# Patient Record
Sex: Male | Born: 1995 | Hispanic: No | Marital: Single | Smoking: Never smoker
Health system: Southern US, Community
[De-identification: ages and names within clinical notes are randomized; demographics above are authoritative.]

## PROBLEM LIST (undated history)

## (undated) ENCOUNTER — Emergency Department: Discharge status: Absent without leave | Payer: BLUE CROSS/BLUE SHIELD

---
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
## 2013-02-26 ENCOUNTER — Emergency Department (HOSPITAL_BASED_OUTPATIENT_CLINIC_OR_DEPARTMENT_OTHER)
Admission: EM | Admit: 2013-02-26 | Discharge: 2013-02-26 | Disposition: A | Discharge status: Home / Self Care | Payer: Medicaid Other | Attending: Emergency Medicine | Admitting: Emergency Medicine

## 2013-02-26 ENCOUNTER — Encounter (HOSPITAL_BASED_OUTPATIENT_CLINIC_OR_DEPARTMENT_OTHER): Payer: Self-pay | Admitting: Emergency Medicine

## 2013-02-26 DIAGNOSIS — R05 Cough: Secondary | ICD-10-CM | POA: Insufficient documentation

## 2013-02-26 DIAGNOSIS — G510 Bell's palsy: Secondary | ICD-10-CM | POA: Insufficient documentation

## 2013-02-26 DIAGNOSIS — J3489 Other specified disorders of nose and nasal sinuses: Secondary | ICD-10-CM | POA: Insufficient documentation

## 2013-02-26 DIAGNOSIS — R059 Cough, unspecified: Secondary | ICD-10-CM | POA: Insufficient documentation

## 2013-02-26 DIAGNOSIS — H5789 Other specified disorders of eye and adnexa: Secondary | ICD-10-CM | POA: Insufficient documentation

## 2013-02-26 DIAGNOSIS — IMO0002 Reserved for concepts with insufficient information to code with codable children: Secondary | ICD-10-CM | POA: Insufficient documentation

## 2013-02-26 DIAGNOSIS — Q674 Other congenital deformities of skull, face and jaw: Secondary | ICD-10-CM | POA: Insufficient documentation

## 2013-02-26 MED ORDER — PREDNISONE 20 MG PO TABS: 60.0000 mg | ORAL_TABLET | Freq: Every day | ORAL | Status: AC

## 2013-02-26 NOTE — ED Provider Notes (Signed)
CSN: [id number]     Arrival date & time 02/26/13  1217 History   First MD Initiated Contact with Patient 02/26/13 1342     Chief Complaint  Patient presents with  . Numbness   (Consider location/radiation/quality/duration/timing/severity/associated sxs/prior Treatment) HPI  Patient is a 17 yo male with no past medical history who presents with onset of left facial weakness. Notes this started 3 days ago. Noted left eye could not close and left mouth was drooping. Notes no issues swallowing. Notes that his left eye has become red and is watering. He denies any numbness or weakness. He denies history of bells palsy. Notes history of cough, congestion, and watery eyes 1-2 weeks ago that has resolved.  History reviewed. No pertinent past medical history. History reviewed. No pertinent past surgical history. No family history on file. History  Substance Use Topics  . Smoking status: Never Smoker   . Smokeless tobacco: Not on file  . Alcohol Use: No    Review of Systems  Constitutional: Negative for fever.  HENT: Positive for congestion.   Eyes: Positive for redness.  Respiratory: Positive for cough. Negative for chest tightness and shortness of breath.   Cardiovascular: Negative for chest pain.  Gastrointestinal: Negative for abdominal pain.  Musculoskeletal: Negative for gait problem.  Neurological: Positive for facial asymmetry. Negative for weakness, numbness and headaches.    Allergies  Review of patient's allergies indicates no known allergies.  Home Medications   Current Outpatient Rx  Name  Route  Sig  Dispense  Refill  . ondansetron (ZOFRAN ODT) 8 MG disintegrating tablet      8mg  ODT q8  hours prn nausea   4 tablet   0   . predniSONE (DELTASONE) 20 MG tablet   Oral   Take 3 tablets (60 mg total) by mouth daily.   21 tablet   0    BP 126/73  Pulse 62  Temp(Src) 98.6 F (37 C) (Oral)  Resp 16  SpO2 100% Physical Exam  Constitutional: He appears  well-developed and well-nourished. No distress.  HENT:  Head: Normocephalic and atraumatic.  Mouth/Throat: Oropharynx is clear and moist.  Eyes: EOM are normal. Pupils are equal, round, and reactive to light. Right eye exhibits no discharge. Left eye exhibits no discharge.  Cardiovascular: Normal rate, regular rhythm and normal heart sounds.   Pulmonary/Chest: Effort normal and breath sounds normal.  Musculoskeletal: He exhibits no edema.  Neurological: He is alert.  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    ED Course  Procedures (including critical care time) Labs Review Labs Reviewed - No data to display Imaging Review No results found.  EKG Interpretation   None       MDM   1. Seventh nerve palsy    Patient seen and examined. Patient with apparent isolated seventh nerve palsy following reported URI. Discussed with the patient that this is something that can follow a URI. Unlikely to be a CVA given normal neuro exam and given that forehead as well as lower face in involved. Discussed that the most important issue is protecting his eye from becoming too dry. Advised to obtain artifical tears to use every  2-3 hours as needed or more frequently if his eye feels dry and to obtain lacri-lube to use at night for his eye. Will treat with a course of prednisone given still in the acute setting. Patient and mother given contact information for Dr Anne Hahn of [facility] for follow-up of this issue. Advised to refrain from playing soccer until able to close his left eye to limit potential exposures.  Discussed return precautions with the patient and his mother and they voiced understanding.  This patient was discussed and seen with my attending Dr Rosalia Hammers.  Marikay Alar, MD [facility] PGY-2 02/26/13 3:45 pm    Glori Luis, MD 02/26/13 [phone]

## 2013-02-26 NOTE — ED Notes (Signed)
3 days he has had numbness to the left side of his face. Unable to completely close his left eye. Paralysis on the left side of his mouth. No hx of Bells Palsy.

## 2013-02-28 NOTE — ED Provider Notes (Signed)
History/physical exam/procedure(Rodriguez) were performed by non-physician practitioner and as supervising physician I was immediately available for consultation/collaboration. I have reviewed all notes and am in agreement with care and plan.  I performed a history and physical examination of Gregg Rodriguez and discussed his management with Dr. Birdie Sons.  I agree with the history, physical, assessment, and plan of care, with the following exceptions: None  I was present for the following procedures: None Time Spent in Critical Care of the patient: None Time spent in discussions with the patient and family: 5  Gregg Rodriguez    [name], MD 02/28/13 1459

## 2013-10-08 ENCOUNTER — Emergency Department (HOSPITAL_BASED_OUTPATIENT_CLINIC_OR_DEPARTMENT_OTHER)
Admission: EM | Admit: 2013-10-08 | Discharge: 2013-10-08 | Disposition: A | Discharge status: Home / Self Care | Payer: Medicaid Other | Attending: Emergency Medicine | Admitting: Emergency Medicine

## 2013-10-08 ENCOUNTER — Encounter (HOSPITAL_BASED_OUTPATIENT_CLINIC_OR_DEPARTMENT_OTHER): Payer: Self-pay | Admitting: Emergency Medicine

## 2013-10-08 ENCOUNTER — Emergency Department (HOSPITAL_BASED_OUTPATIENT_CLINIC_OR_DEPARTMENT_OTHER): Payer: Medicaid Other

## 2013-10-08 DIAGNOSIS — IMO0002 Reserved for concepts with insufficient information to code with codable children: Secondary | ICD-10-CM | POA: Insufficient documentation

## 2013-10-08 DIAGNOSIS — R1032 Left lower quadrant pain: Secondary | ICD-10-CM | POA: Insufficient documentation

## 2013-10-08 DIAGNOSIS — IMO0001 Reserved for inherently not codable concepts without codable children: Secondary | ICD-10-CM | POA: Insufficient documentation

## 2013-10-08 DIAGNOSIS — R42 Dizziness and giddiness: Secondary | ICD-10-CM | POA: Insufficient documentation

## 2013-10-08 DIAGNOSIS — R109 Unspecified abdominal pain: Secondary | ICD-10-CM

## 2013-10-08 DIAGNOSIS — J029 Acute pharyngitis, unspecified: Secondary | ICD-10-CM | POA: Insufficient documentation

## 2013-10-08 DIAGNOSIS — R1012 Left upper quadrant pain: Secondary | ICD-10-CM | POA: Insufficient documentation

## 2013-10-08 DIAGNOSIS — R197 Diarrhea, unspecified: Secondary | ICD-10-CM | POA: Insufficient documentation

## 2013-10-08 DIAGNOSIS — R112 Nausea with vomiting, unspecified: Secondary | ICD-10-CM | POA: Insufficient documentation

## 2013-10-08 LAB — CBC WITH DIFFERENTIAL/PLATELET
Basophils Absolute: 0 K/uL (ref 0.0–0.1)
Basophils Relative: 0 % (ref 0–1)
Eosinophils Absolute: 0 K/uL (ref 0.0–0.7)
Eosinophils Relative: 0 % (ref 0–5)
HCT: 47.5 % (ref 39.0–52.0)
Hemoglobin: 17.1 g/dL — ABNORMAL HIGH (ref 13.0–17.0)
Lymphocytes Relative: 7 % — ABNORMAL LOW (ref 12–46)
Lymphs Abs: 0.8 K/uL (ref 0.7–4.0)
MCH: 29.3 pg (ref 26.0–34.0)
MCHC: 36 g/dL (ref 30.0–36.0)
MCV: 81.3 fL (ref 78.0–100.0)
Monocytes Absolute: 0.6 K/uL (ref 0.1–1.0)
Monocytes Relative: 5 % (ref 3–12)
Neutro Abs: 10.4 K/uL — ABNORMAL HIGH (ref 1.7–7.7)
Neutrophils Relative %: 88 % — ABNORMAL HIGH (ref 43–77)
Platelets: 234 K/uL (ref 150–400)
RBC: 5.84 MIL/uL — ABNORMAL HIGH (ref 4.22–5.81)
RDW: 13.9 % (ref 11.5–15.5)
WBC: 11.9 K/uL — ABNORMAL HIGH (ref 4.0–10.5)

## 2013-10-08 LAB — COMPREHENSIVE METABOLIC PANEL WITH GFR
ALT: 39 U/L (ref 0–53)
AST: 30 U/L (ref 0–37)
Albumin: 4.8 g/dL (ref 3.5–5.2)
Alkaline Phosphatase: 73 U/L (ref 39–117)
BUN: 12 mg/dL (ref 6–23)
CO2: 26 meq/L (ref 19–32)
Calcium: 10 mg/dL (ref 8.4–10.5)
Chloride: 99 meq/L (ref 96–112)
Creatinine, Ser: 1.1 mg/dL (ref 0.50–1.35)
GFR calc Af Amer: >90 mL/min (ref >90)
GFR calc non Af Amer: >90 mL/min (ref >90)
Glucose, Bld: 105 mg/dL — ABNORMAL HIGH (ref 70–99)
Potassium: 4.3 meq/L (ref 3.7–5.3)
Sodium: 138 meq/L (ref 137–147)
Total Bilirubin: 1 mg/dL (ref 0.3–1.2)
Total Protein: 8.3 g/dL (ref 6.0–8.3)

## 2013-10-08 LAB — LIPASE, BLOOD: Lipase: 17 U/L (ref 11–59)

## 2013-10-08 MED ORDER — SODIUM CHLORIDE 0.9 % IV SOLN
INTRAVENOUS | Status: AC
  Administered 2013-10-08: 18:00:00 via INTRAVENOUS

## 2013-10-08 MED ORDER — ONDANSETRON 8 MG PO TBDP: 8.0000 mg | ORAL_TABLET | Freq: Three times a day (TID) | ORAL | Status: AC | PRN

## 2013-10-08 MED ORDER — ONDANSETRON HCL 4 MG/2ML IJ SOLN
4.0000 mg | Freq: Once | INTRAMUSCULAR | Status: AC
  Administered 2013-10-08: 4 mg via INTRAVENOUS
  Filled 2013-10-08: qty 2

## 2013-10-08 NOTE — ED Provider Notes (Signed)
CSN: [id number]     Arrival date & time 10/08/13  1608 History   First MD Initiated Contact with Patient 10/08/13 1705     Chief Complaint  Patient presents with  . Fever     (Consider location/radiation/quality/duration/timing/severity/associated sxs/prior Treatment) Patient is a 18 y.o. male presenting with fever. The history is provided by the patient.  Fever Max temp prior to arrival:  99 Temp source:  Subjective and axillary Severity:  Mild Onset quality:  Gradual Duration:  30 days Progression:  Unchanged Chronicity:  New Relieved by:  Acetaminophen Worsened by:  Nothing tried Associated symptoms: chills, cough, diarrhea, headaches, myalgias, nausea, sore throat (with cough) and vomiting   Associated symptoms: no confusion, no congestion, no dysuria, no ear pain and no rash  Chest pain: with cough.    Gregg Rodriguez is a 18 y.o. male who presents to the ED with complaint of fever off and on for the past month. For the past 2 days he has had vomiting and loose stools. He complains of left side abdominal pain.   History reviewed. No pertinent past medical history. History reviewed. No pertinent past surgical history. No family history on file. History  Substance Use Topics  . Smoking status: Never Smoker   . Smokeless tobacco: Not on file  . Alcohol Use: No    Review of Systems  Constitutional: Positive for fever and chills.  HENT: Positive for sore throat (with cough). Negative for congestion, ear pain, mouth sores and trouble swallowing.   Eyes: Negative for redness, itching and visual disturbance.  Respiratory: Positive for cough.   Cardiovascular: Chest pain: with cough.  Gastrointestinal: Positive for nausea, vomiting, abdominal pain and diarrhea.  Genitourinary: Negative for dysuria, urgency and flank pain.  Musculoskeletal: Positive for myalgias. Negative for back pain.  Skin: Negative for rash.  Neurological: Positive for light-headedness and headaches.   Psychiatric/Behavioral: Negative for confusion. The patient is not nervous/anxious.       Allergies  Review of patient's allergies indicates no known allergies.  Home Medications   Prior to Admission medications   Medication Sig Start Date End Date Taking? Authorizing Provider  ondansetron (ZOFRAN ODT) 8 MG disintegrating tablet 8mg  ODT q8  hours prn nausea 06/28/12   April K Palumbo-Rasch, MD  predniSONE (DELTASONE) 20 MG tablet Take 3 tablets (60 mg total) by mouth daily. 02/26/13   Glori Luis, MD   BP 136/73  Pulse 94  Temp(Src) 98.3 F (36.8 C) (Oral)  Resp 22  Ht 5\' 9"  (1.753 m)  Wt 185 lb (83.915 kg)  BMI 27.31 kg/m2  SpO2 100% Physical Exam  Nursing note and vitals reviewed. Constitutional: He is oriented to person, place, and time. He appears well-developed and well-nourished. No distress.  HENT:  Head: Normocephalic and atraumatic.  Right Ear: Tympanic membrane normal.  Left Ear: Tympanic membrane normal.  Nose: Nose normal.  Mouth/Throat: Uvula is midline, oropharynx is clear and moist and mucous membranes are normal.  Eyes: EOM are normal.  Neck: Neck supple.  Cardiovascular: Normal rate, regular rhythm and normal heart sounds.   Pulmonary/Chest: Effort normal. He has no wheezes. He has no rales.  Abdominal: Soft. Bowel sounds are increased. There is tenderness in the left upper quadrant and left lower quadrant. There is no rebound, no guarding and no CVA tenderness.  Musculoskeletal: Normal range of motion.  Neurological: He is alert and oriented to person, place, and time. No cranial nerve deficit.  Skin: Skin is warm and dry.  Psychiatric: He has a normal mood and affect. His behavior is normal.    ED Course  Procedures (including critical care time) Labs Review Results for orders placed during the hospital encounter of 10/08/13 (from the past 24 hour(s))  CBC WITH DIFFERENTIAL     Status: Abnormal   Collection Time    10/08/13  6:08 PM       Result Value Ref Range   WBC 11.9 (*) 4.0 - 10.5 K/uL   RBC 5.84 (*) 4.22 - 5.81 MIL/uL   Hemoglobin 17.1 (*) 13.0 - 17.0 g/dL   HCT 19.147.5  47.839.0 - 29.552.0 %   MCV 81.3  78.0 - 100.0 fL   MCH 29.3  26.0 - 34.0 pg   MCHC 36.0  30.0 - 36.0 g/dL   RDW 62.113.9  30.811.5 - 65.715.5 %   Platelets 234  150 - 400 K/uL   Neutrophils Relative % 88 (*) 43 - 77 %   Neutro Abs 10.4 (*) 1.7 - 7.7 K/uL   Lymphocytes Relative 7 (*) 12 - 46 %   Lymphs Abs 0.8  0.7 - 4.0 K/uL   Monocytes Relative 5  3 - 12 %   Monocytes Absolute 0.6  0.1 - 1.0 K/uL   Eosinophils Relative 0  0 - 5 %   Eosinophils Absolute 0.0  0.0 - 0.7 K/uL   Basophils Relative 0  0 - 1 %   Basophils Absolute 0.0  0.0 - 0.1 K/uL  COMPREHENSIVE METABOLIC PANEL     Status: Abnormal   Collection Time    10/08/13  6:08 PM      Result Value Ref Range   Sodium 138  137 - 147 mEq/L   Potassium 4.3  3.7 - 5.3 mEq/L   Chloride 99  96 - 112 mEq/L   CO2 26  19 - 32 mEq/L   Glucose, Bld 105 (*) 70 - 99 mg/dL   BUN 12  6 - 23 mg/dL   Creatinine, Ser 8.461.10  0.50 - 1.35 mg/dL   Calcium 96.210.0  8.4 - 95.210.5 mg/dL   Total Protein 8.3  6.0 - 8.3 g/dL   Albumin 4.8  3.5 - 5.2 g/dL   AST 30  0 - 37 U/L   ALT 39  0 - 53 U/L   Alkaline Phosphatase 73  39 - 117 U/L   Total Bilirubin 1.0  0.3 - 1.2 mg/dL   GFR calc non Af Amer >90  >90 mL/min   GFR calc Af Amer >90  >90 mL/min  LIPASE, BLOOD     Status: None   Collection Time    10/08/13  6:08 PM      Result Value Ref Range   Lipase 17  11 - 59 U/L    Dg Chest 2 View  10/08/2013   CLINICAL DATA:  Intermittent fever for a month.  EXAM: CHEST  2 VIEW  COMPARISON:  None.  FINDINGS: Lungs are clear. Heart size is normal. No pneumothorax or pleural effusion.  IMPRESSION: Negative chest.   Electronically Signed   By: Drusilla Kannerhomas  Dalessio M.D.   On: 10/08/2013 18:24    MDM: I discussed this case with Dr. Elesa MassedWard   Patient states his abdominal pain has resolved after IV fluids and Zofran.  Patient re examined, abdomen soft,  non tender with palpation.   18 y.o. male with body aches, abdominal pain, n/v/d that has improved during his ER visit. Discussed with the patient and his family member reasons for  abdominal pain. Discussed CT scan. They state that since the pain has resolved and no n/v they will wait and see how he does. He will stay on clear liquids tonight and advance to the [employer]. He will return for worsening symptoms. Consider viral gastroenteritis, diverticulitis. I have reviewed this patient's vital signs, nurses notes, appropriate labs and imaging.  Patient stable for discharge without further screening at this time.    Medication List    TAKE these medications       ondansetron 8 MG disintegrating tablet  Commonly known as:  ZOFRAN ODT  Take 1 tablet (8 mg total) by mouth every 8 (eight) hours as needed for nausea or vomiting.      ASK your doctor about these medications       predniSONE 20 MG tablet  Commonly known as:  DELTASONE  Take 3 tablets (60 mg total) by mouth daily.           [place], [place] 10/09/13 [phone]

## 2013-10-08 NOTE — ED Notes (Signed)
Fever on and off for a month. Cough, headache, body aches, chills.

## 2013-10-08 NOTE — Discharge Instructions (Signed)
Take the medication as directed for nausea. Stay on a clear liquid diet tonight and then advance to the [employer]. Return for worsening symptoms.

## 2013-10-09 ENCOUNTER — Encounter (HOSPITAL_BASED_OUTPATIENT_CLINIC_OR_DEPARTMENT_OTHER): Payer: Self-pay | Admitting: Emergency Medicine

## 2013-10-09 ENCOUNTER — Emergency Department (HOSPITAL_BASED_OUTPATIENT_CLINIC_OR_DEPARTMENT_OTHER)
Admission: EM | Admit: 2013-10-09 | Discharge: 2013-10-09 | Disposition: A | Discharge status: Home / Self Care | Payer: Medicaid Other | Attending: Emergency Medicine | Admitting: Emergency Medicine

## 2013-10-09 ENCOUNTER — Emergency Department (HOSPITAL_BASED_OUTPATIENT_CLINIC_OR_DEPARTMENT_OTHER): Payer: Medicaid Other

## 2013-10-09 DIAGNOSIS — Z791 Long term (current) use of non-steroidal anti-inflammatories (NSAID): Secondary | ICD-10-CM | POA: Insufficient documentation

## 2013-10-09 DIAGNOSIS — IMO0002 Reserved for concepts with insufficient information to code with codable children: Secondary | ICD-10-CM | POA: Insufficient documentation

## 2013-10-09 DIAGNOSIS — R091 Pleurisy: Secondary | ICD-10-CM | POA: Insufficient documentation

## 2013-10-09 LAB — D-DIMER, QUANTITATIVE: D-Dimer, Quant: <0.27 ug/mL-FEU (ref 0.00–0.48)

## 2013-10-09 MED ORDER — NAPROXEN 375 MG PO TABS
375.0000 mg | ORAL_TABLET | Freq: Two times a day (BID) | ORAL | Status: AC
Start: 2013-10-09 — End: ?

## 2013-10-09 NOTE — ED Provider Notes (Signed)
CSN: [id number]     Arrival date & time 10/09/13  0612 History   First MD Initiated Contact with Patient 10/09/13 [phone]     Chief Complaint  Patient presents with  . Pleurisy     (Consider location/radiation/quality/duration/timing/severity/associated sxs/prior Treatment) HPI Comments: Pt was seen here yesterday for abd pain, n/v, cough.  He comes back today for chest pain.  States that he was sleeping and woke up with sharp intense pain to right chest, non-radiating.  Worse with breathing, associated with SOB.  Lasted a couple of hours.  Has subsided now.  Denies any symptoms now.  States that his abd pain, vomiting has resolved.  No current fevers.  No leg pain or swelling.  No smoking hx.  No recent immobilization.     History reviewed. No pertinent past medical history. History reviewed. No pertinent past surgical history. History reviewed. No pertinent family history. History  Substance Use Topics  . Smoking status: Never Smoker   . Smokeless tobacco: Not on file  . Alcohol Use: No    Review of Systems  Constitutional: Negative for fever, chills, diaphoresis and fatigue.  HENT: Negative for congestion, rhinorrhea and sneezing.   Eyes: Negative.   Respiratory: Positive for shortness of breath. Negative for cough and chest tightness.   Cardiovascular: Positive for chest pain. Negative for leg swelling.  Gastrointestinal: Negative for nausea, vomiting, abdominal pain, diarrhea and blood in stool.  Genitourinary: Negative for frequency, hematuria, flank pain and difficulty urinating.  Musculoskeletal: Negative for arthralgias and back pain.  Skin: Negative for rash.  Neurological: Negative for dizziness, speech difficulty, weakness, numbness and headaches.      Allergies  Review of patient's allergies indicates no known allergies.  Home Medications   Prior to Admission medications   Medication Sig Start Date End Date Taking? Authorizing Provider  naproxen (NAPROSYN) 375 MG  tablet Take 1 tablet (375 mg total) by mouth 2 (two) times daily. 10/09/13   Rolan BuccoMelanie Latresha Yahr, MD  ondansetron (ZOFRAN ODT) 8 MG disintegrating tablet Take 1 tablet (8 mg total) by mouth every 8 (eight) hours as needed for nausea or vomiting. 10/08/13   Hope Orlene OchM Neese, NP  predniSONE (DELTASONE) 20 MG tablet Take 3 tablets (60 mg total) by mouth daily. 02/26/13   Glori LuisEric G Sonnenberg, MD   BP 134/81  Pulse 60  Temp(Src) 98.2 F (36.8 C) (Oral)  Resp 19  Ht 5\' 9"  (1.753 m)  Wt 185 lb (83.915 kg)  BMI 27.31 kg/m2  SpO2 99% Physical Exam  Constitutional: He is oriented to person, place, and time. He appears well-developed and well-nourished.  HENT:  Head: Normocephalic and atraumatic.  Eyes: Pupils are equal, round, and reactive to light.  Neck: Normal range of motion. Neck supple.  Cardiovascular: Normal rate, regular rhythm and normal heart sounds.   Pulmonary/Chest: Effort normal and breath sounds normal. No respiratory distress. He has no wheezes. He has no rales. He exhibits no tenderness.  Abdominal: Soft. Bowel sounds are normal. There is no tenderness. There is no rebound and no guarding.  Musculoskeletal: Normal range of motion. He exhibits no edema.  No calf tenderness  Lymphadenopathy:    He has no cervical adenopathy.  Neurological: He is alert and oriented to person, place, and time.  Skin: Skin is warm and dry. No rash noted.  Psychiatric: He has a normal mood and affect.    ED Course  Procedures (including critical care time) Labs Review Labs Reviewed  D-DIMER, QUANTITATIVE  Imaging Review Dg Chest 2 View  10/09/2013   CLINICAL DATA:  Right chest pain, smoker.  EXAM: CHEST  2 VIEW  COMPARISON:  Chest radiograph October 08, 2013.  FINDINGS: Cardiomediastinal silhouette is unremarkable. The lungs are clear without pleural effusions or focal consolidations. Trachea projects midline and there is no pneumothorax. Soft tissue planes and included osseous structures are non-suspicious.   IMPRESSION: No acute cardiopulmonary process; normal chest radiograph.   Electronically Signed   By: Awilda Metro   On: 10/09/2013 06:55   Dg Chest 2 View  10/08/2013   CLINICAL DATA:  Intermittent fever for a month.  EXAM: CHEST  2 VIEW  COMPARISON:  None.  FINDINGS: Lungs are clear. Heart size is normal. No pneumothorax or pleural effusion.  IMPRESSION: Negative chest.   Electronically Signed   By: Drusilla Kanner M.D.   On: 10/08/2013 18:24     EKG Interpretation None      MDM   Final diagnoses:  Pleurisy    Pt with episode of sharp, pleuritic right sided CP.  Resolved now.  Symptoms not consistent with ACS.  No signs of PTX, pneumonia.  No suggestions of PE, negative D-dimer.  Likely pleurisy.  Will d/c with rx for naprosyn.  F/u as needed.    Rolan Bucco, MD 10/09/13 [phone]

## 2013-10-09 NOTE — Discharge Instructions (Signed)
Pleurisy  Pleurisy is an inflammation and swelling of the lining of the lungs (pleura). Because of this inflammation, it hurts to breathe. It can be aggravated by coughing, laughing, or deep breathing. Pleurisy is often caused by an underlying infection or disease.   HOME CARE INSTRUCTIONS   Monitor your pleurisy for any changes. The following actions may help to alleviate any discomfort you are experiencing:   Medicine may help with pain. Only take over-the-counter or prescription medicines for pain, discomfort, or fever as directed by your health care provider.   Only take antibiotic medicine as directed. Make sure to finish it even if you start to feel better.  SEEK MEDICAL CARE IF:    Your pain is not controlled with medicine or is increasing.   You have an increase in pus-like (purulent) secretions brought up with coughing.  SEEK IMMEDIATE MEDICAL CARE IF:    You have blue or dark lips, fingernails, or toenails.   You are coughing up blood.   You have increased difficulty breathing.   You have continuing pain unrelieved by medicine or pain lasting more than 1 week.   You have pain that radiates into your neck, arms, or jaw.   You develop increased shortness of breath or wheezing.   You develop a fever, rash, vomiting, fainting, or other serious symptoms.  MAKE SURE YOU:   Understand these instructions.    Will watch your condition.    Will get help right away if you are not doing well or get worse.    Document Released: 04/19/2005 Document Revised: 12/20/2012 Document Reviewed: 10/01/2012  [employer] Patient Information 2014 [employer]

## 2013-10-09 NOTE — ED Notes (Addendum)
Pt reports he is having right chest pain awoke him from sleep admit to recent start smoking Hookah  But denies use in last several days

## 2013-10-11 NOTE — ED Provider Notes (Signed)
Medical screening examination/treatment/procedure(s) were performed by non-physician practitioner and as supervising physician I was immediately available for consultation/collaboration.   EKG Interpretation None        Kristen N Ward, DO 10/11/13 0802 

## 2014-09-05 ENCOUNTER — Emergency Department (HOSPITAL_BASED_OUTPATIENT_CLINIC_OR_DEPARTMENT_OTHER)
Admission: EM | Admit: 2014-09-05 | Discharge: 2014-09-05 | Disposition: A | Discharge status: Home / Self Care | Payer: BLUE CROSS/BLUE SHIELD | Attending: Emergency Medicine | Admitting: Emergency Medicine

## 2014-09-05 ENCOUNTER — Encounter (HOSPITAL_BASED_OUTPATIENT_CLINIC_OR_DEPARTMENT_OTHER): Payer: Self-pay | Admitting: Emergency Medicine

## 2014-09-05 DIAGNOSIS — M545 Low back pain, unspecified: Secondary | ICD-10-CM

## 2014-09-05 DIAGNOSIS — R42 Dizziness and giddiness: Secondary | ICD-10-CM | POA: Diagnosis not present

## 2014-09-05 DIAGNOSIS — M791 Myalgia: Secondary | ICD-10-CM | POA: Diagnosis not present

## 2014-09-05 DIAGNOSIS — R110 Nausea: Secondary | ICD-10-CM | POA: Diagnosis not present

## 2014-09-05 DIAGNOSIS — Z7952 Long term (current) use of systemic steroids: Secondary | ICD-10-CM | POA: Insufficient documentation

## 2014-09-05 DIAGNOSIS — H538 Other visual disturbances: Secondary | ICD-10-CM | POA: Diagnosis not present

## 2014-09-05 DIAGNOSIS — M79671 Pain in right foot: Secondary | ICD-10-CM | POA: Diagnosis not present

## 2014-09-05 DIAGNOSIS — Z791 Long term (current) use of non-steroidal anti-inflammatories (NSAID): Secondary | ICD-10-CM | POA: Insufficient documentation

## 2014-09-05 LAB — CBC WITH DIFFERENTIAL/PLATELET
BASOS PCT: 0 % (ref 0–1)
Basophils Absolute: 0 10*3/uL (ref 0.0–0.1)
EOS PCT: 2 % (ref 0–5)
Eosinophils Absolute: 0.2 10*3/uL (ref 0.0–0.7)
HEMATOCRIT: 45.2 % (ref 39.0–52.0)
Hemoglobin: 16 g/dL (ref 13.0–17.0)
Lymphocytes Relative: 36 % (ref 12–46)
Lymphs Abs: 2.8 10*3/uL (ref 0.7–4.0)
MCH: 28.8 pg (ref 26.0–34.0)
MCHC: 35.4 g/dL (ref 30.0–36.0)
MCV: 81.3 fL (ref 78.0–100.0)
MONO ABS: 0.6 10*3/uL (ref 0.1–1.0)
MONOS PCT: 8 % (ref 3–12)
Neutro Abs: 4.3 10*3/uL (ref 1.7–7.7)
Neutrophils Relative %: 54 % (ref 43–77)
Platelets: 238 10*3/uL (ref 150–400)
RBC: 5.56 MIL/uL (ref 4.22–5.81)
RDW: 13.4 % (ref 11.5–15.5)
WBC: 8 10*3/uL (ref 4.0–10.5)

## 2014-09-05 LAB — BASIC METABOLIC PANEL
Anion gap: 11 (ref 5–15)
BUN: 18 mg/dL (ref 6–20)
CO2: 28 mmol/L (ref 22–32)
Calcium: 9.9 mg/dL (ref 8.9–10.3)
Chloride: 103 mmol/L (ref 101–111)
Creatinine, Ser: 1.23 mg/dL (ref 0.61–1.24)
GFR calc Af Amer: >60 mL/min (ref >60)
GFR calc non Af Amer: >60 mL/min (ref >60)
Glucose, Bld: 95 mg/dL (ref 70–99)
Potassium: 3.9 mmol/L (ref 3.5–5.1)
Sodium: 142 mmol/L (ref 135–145)

## 2014-09-05 LAB — URINALYSIS, ROUTINE W REFLEX MICROSCOPIC
BILIRUBIN URINE: NEGATIVE
GLUCOSE, UA: NEGATIVE mg/dL
HGB URINE DIPSTICK: NEGATIVE
Ketones, ur: 15 mg/dL — AB
Leukocytes, UA: NEGATIVE
Nitrite: NEGATIVE
PH: 6 (ref 5.0–8.0)
Protein, ur: NEGATIVE mg/dL
SPECIFIC GRAVITY, URINE: 1.03 (ref 1.005–1.030)
UROBILINOGEN UA: 0.2 mg/dL (ref 0.0–1.0)

## 2014-09-05 MED ORDER — TRAMADOL HCL 50 MG PO TABS: 50.0000 mg | ORAL_TABLET | Freq: Four times a day (QID) | ORAL | Status: AC | PRN

## 2014-09-05 MED ORDER — NAPROXEN 500 MG PO TABS: 500.0000 mg | ORAL_TABLET | Freq: Two times a day (BID) | ORAL | Status: AC

## 2014-09-05 NOTE — ED Notes (Signed)
C/o lower back pain x 3 days and left foot tingling onset this pm,  Denies inj

## 2014-09-05 NOTE — ED Notes (Signed)
Pt states back pain and R foot pain x 3 days, got worse at work today. Denies injuries.

## 2014-09-05 NOTE — ED Provider Notes (Signed)
CSN: [id number]     Arrival date & time 09/05/14  1950 History  This chart was scribed for Vanetta MuldersScott Idona Stach, MD by Annye AsaAnna Dorsett, ED Scribe. This patient was seen in room MH02/MH02 and the patient's care was started at 9:49 PM.    Chief Complaint  Patient presents with  . Back Pain  . Foot Pain   Patient is a 19 y.o. male presenting with back pain. The history is provided by the patient. No language interpreter was used.  Back Pain Location:  Lumbar spine Quality:  Aching and stabbing Radiates to:  Does not radiate Pain severity:  Moderate Pain is:  Same all the time Onset quality:  Gradual Duration:  3 days Timing:  Constant Progression:  Worsening Chronicity:  New Context: not physical stress, not recent injury and not twisting   Relieved by:  Nothing Worsened by:  Nothing tried Ineffective treatments:  None tried Associated symptoms: no dysuria, no fever, no numbness and no weakness     HPI Comments: Gregg Rodriguez is a 19 y.o. male who presents to the Emergency Department complaining of 3 days of "sharp, aching" lower back pain (L>R, rated 7/10 at worst) and lightheadedness, with nausea beginning yesterday. No modifying factors noted at this time. Patient also reports an instance of "sharp" pain in the ball of his right foot, lasting about an hour before resolving without treatment. He notes recent blurred vision. He denies fevers, sore throat, cold symptoms, dysuria. He denies prior experience with back pain, denies recent known tick bites, denies recent trauma or injury.   History reviewed. No pertinent past medical history. History reviewed. No pertinent past surgical history. History reviewed. No pertinent family history. History  Substance Use Topics  . Smoking status: Never Smoker   . Smokeless tobacco: Not on file  . Alcohol Use: No    Review of Systems  Constitutional: Negative for fever and chills.  HENT: Negative for rhinorrhea and sore throat.   Eyes: Positive for  visual disturbance.  Respiratory: Negative for cough.   Cardiovascular: Negative for leg swelling.  Gastrointestinal: Positive for nausea. Negative for vomiting and diarrhea.  Genitourinary: Negative for dysuria, frequency and hematuria.  Musculoskeletal: Positive for back pain. Negative for neck pain.  Skin: Negative for rash.  Neurological: Positive for light-headedness. Negative for dizziness, weakness and numbness.  Hematological: Does not bruise/bleed easily.  Psychiatric/Behavioral: Negative for confusion.    Allergies  Review of patient's allergies indicates no known allergies.  Home Medications   Prior to Admission medications   Medication Sig Start Date End Date Taking? Authorizing Provider  naproxen (NAPROSYN) 375 MG tablet Take 1 tablet (375 mg total) by mouth 2 (two) times daily. 10/09/13   Rolan BuccoMelanie Belfi, MD  naproxen (NAPROSYN) 500 MG tablet Take 1 tablet (500 mg total) by mouth 2 (two) times daily. 09/05/14   Vanetta MuldersScott Roquel Burgin, MD  ondansetron (ZOFRAN ODT) 8 MG disintegrating tablet Take 1 tablet (8 mg total) by mouth every 8 (eight) hours as needed for nausea or vomiting. 10/08/13   Hope Orlene OchM Neese, NP  predniSONE (DELTASONE) 20 MG tablet Take 3 tablets (60 mg total) by mouth daily. 02/26/13   Glori LuisEric G Sonnenberg, MD  traMADol (ULTRAM) 50 MG tablet Take 1 tablet (50 mg total) by mouth every 6 (six) hours as needed. 09/05/14   Vanetta MuldersScott Ronelle Smallman, MD   BP 141/84 mmHg  Pulse 66  Temp(Src) 98.2 F (36.8 C) (Oral)  Resp 18  Ht 5\' 9"  (1.753 m)  Wt 180 lb (  81.647 kg)  BMI 26.57 kg/m2  SpO2 95% Physical Exam  Constitutional: He is oriented to person, place, and time. He appears well-developed and well-nourished.  HENT:  Head: Normocephalic and atraumatic.  Moist mucous membranes  Eyes: EOM are normal. Pupils are equal, round, and reactive to light. No scleral icterus.  Neck: No tracheal deviation present.  Cardiovascular: Normal rate, regular rhythm and normal heart sounds.  Exam  reveals no gallop and no friction rub.   No murmur heard. Pulmonary/Chest: Effort normal and breath sounds normal. No respiratory distress. He has no wheezes. He has no rales.  Abdominal: Soft. Bowel sounds are normal. He exhibits no distension. There is no tenderness. There is no rebound and no guarding.  Musculoskeletal: He exhibits no edema.  Lymphadenopathy:    He has no cervical adenopathy.  Neurological: He is alert and oriented to person, place, and time. No cranial nerve deficit.  Skin: Skin is warm and dry.  Psychiatric: He has a normal mood and affect. His behavior is normal.  Nursing note and vitals reviewed.   ED Course  Procedures   DIAGNOSTIC STUDIES: Oxygen Saturation is 95% on RA, adequate by my interpretation.    COORDINATION OF CARE: 9:55 PM Discussed treatment plan with pt at bedside and pt agreed to plan.   Labs Review Labs Reviewed  URINALYSIS, ROUTINE W REFLEX MICROSCOPIC - Abnormal; Notable for the following:    Ketones, ur 15 (*)    All other components within normal limits  CBC WITH DIFFERENTIAL/PLATELET  BASIC METABOLIC PANEL   Results for orders placed or performed during the hospital encounter of 09/05/14  Urinalysis, Routine w reflex microscopic  Result Value Ref Range   Color, Urine YELLOW YELLOW   APPearance CLEAR CLEAR   Specific Gravity, Urine 1.030 1.005 - 1.030   pH 6.0 5.0 - 8.0   Glucose, UA NEGATIVE NEGATIVE mg/dL   Hgb urine dipstick NEGATIVE NEGATIVE   Bilirubin Urine NEGATIVE NEGATIVE   Ketones, ur 15 (A) NEGATIVE mg/dL   Protein, ur NEGATIVE NEGATIVE mg/dL   Urobilinogen, UA 0.2 0.0 - 1.0 mg/dL   Nitrite NEGATIVE NEGATIVE   Leukocytes, UA NEGATIVE NEGATIVE  CBC with Differential/Platelet  Result Value Ref Range   WBC 8.0 4.0 - 10.5 K/uL   RBC 5.56 4.22 - 5.81 MIL/uL   Hemoglobin 16.0 13.0 - 17.0 g/dL   HCT 16.1 09.6 - 04.5 %   MCV 81.3 78.0 - 100.0 fL   MCH 28.8 26.0 - 34.0 pg   MCHC 35.4 30.0 - 36.0 g/dL   RDW 40.9 81.1  - 91.4 %   Platelets 238 150 - 400 K/uL   Neutrophils Relative % 54 43 - 77 %   Neutro Abs 4.3 1.7 - 7.7 K/uL   Lymphocytes Relative 36 12 - 46 %   Lymphs Abs 2.8 0.7 - 4.0 K/uL   Monocytes Relative 8 3 - 12 %   Monocytes Absolute 0.6 0.1 - 1.0 K/uL   Eosinophils Relative 2 0 - 5 %   Eosinophils Absolute 0.2 0.0 - 0.7 K/uL   Basophils Relative 0 0 - 1 %   Basophils Absolute 0.0 0.0 - 0.1 K/uL  Basic metabolic panel  Result Value Ref Range   Sodium 142 135 - 145 mmol/L   Potassium 3.9 3.5 - 5.1 mmol/L   Chloride 103 101 - 111 mmol/L   CO2 28 22 - 32 mmol/L   Glucose, Bld 95 70 - 99 mg/dL   BUN 18 6 -  20 mg/dL   Creatinine, Ser 1.611.23 0.61 - 1.24 mg/dL   Calcium 9.9 8.9 - 09.610.3 mg/dL   GFR calc non Af Amer >60 >60 mL/min   GFR calc Af Amer >60 >60 mL/min   Anion gap 11 5 - 15     Imaging Review No results found.   EKG Interpretation None      MDM   Final diagnoses:  Bilateral low back pain without sciatica    Symptoms seem to be consistent with musculoskeletal low back pain. Labs without any other explanation. No leukocytosis no anemia no electrolyte abnormalities no abnormal kidney function. Urinalysis without any specific abnormalities. No evidence of any urinary tract infection. Will treat with anti-inflammatory and pain medicine and return for any new or worse symptoms.     I personally performed the services described in this documentation, which was scribed in my presence. The recorded information has been reviewed and is accurate.       Vanetta MuldersScott Bailyn Spackman, MD 09/05/14 [phone]

## 2014-09-05 NOTE — Discharge Instructions (Signed)
Take the Naprosyn as directed for the next 7 days. Supplement with the tramadol as needed. Return for any new or worse symptoms. Follow-up with your doctor or here if symptoms do not resolve.

## 2018-10-22 ENCOUNTER — Encounter (HOSPITAL_COMMUNITY): Payer: Self-pay | Admitting: Emergency Medicine

## 2018-10-22 ENCOUNTER — Emergency Department (HOSPITAL_COMMUNITY)
Admission: EM | Admit: 2018-10-22 | Discharge: 2018-10-23 | Disposition: A | Discharge status: Home / Self Care | Payer: 59 | Attending: Emergency Medicine | Admitting: Emergency Medicine

## 2018-10-22 ENCOUNTER — Other Ambulatory Visit: Payer: Self-pay

## 2018-10-22 DIAGNOSIS — Y9383 Activity, rough housing and horseplay: Secondary | ICD-10-CM | POA: Insufficient documentation

## 2018-10-22 DIAGNOSIS — Y998 Other external cause status: Secondary | ICD-10-CM | POA: Diagnosis not present

## 2018-10-22 DIAGNOSIS — S43004A Unspecified dislocation of right shoulder joint, initial encounter: Secondary | ICD-10-CM | POA: Insufficient documentation

## 2018-10-22 DIAGNOSIS — Y9389 Activity, other specified: Secondary | ICD-10-CM | POA: Insufficient documentation

## 2018-10-22 DIAGNOSIS — W01190A Fall on same level from slipping, tripping and stumbling with subsequent striking against furniture, initial encounter: Secondary | ICD-10-CM | POA: Diagnosis not present

## 2018-10-22 DIAGNOSIS — Y92018 Other place in single-family (private) house as the place of occurrence of the external cause: Secondary | ICD-10-CM | POA: Diagnosis not present

## 2018-10-22 DIAGNOSIS — S4991XA Unspecified injury of right shoulder and upper arm, initial encounter: Secondary | ICD-10-CM | POA: Diagnosis present

## 2018-10-22 NOTE — ED Triage Notes (Signed)
Pt BIB EMS was horse playing with his brother. He fell onto the corner of a coffee table. Pt c/o right shoulder pain. No other injury. Pt given 250 mcg fentanyl PTA.

## 2018-10-23 ENCOUNTER — Emergency Department (HOSPITAL_COMMUNITY): Payer: 59

## 2018-10-23 MED ORDER — SODIUM CHLORIDE 0.9 % IV SOLN
INTRAVENOUS | Status: DC
  Administered 2018-10-23: 01:00:00 via INTRAVENOUS

## 2018-10-23 MED ORDER — HYDROMORPHONE HCL 1 MG/ML IJ SOLN
1.0000 mg | Freq: Once | INTRAMUSCULAR | Status: AC
  Administered 2018-10-23: 1 mg via INTRAVENOUS
  Filled 2018-10-23: qty 1

## 2018-10-23 MED ORDER — PROPOFOL 10 MG/ML IV BOLUS
1.0000 mg/kg | Freq: Once | INTRAVENOUS | Status: AC
  Administered 2018-10-23: 90 mg via INTRAVENOUS
  Filled 2018-10-23: qty 20

## 2018-10-23 MED ORDER — OXYCODONE-ACETAMINOPHEN 5-325 MG PO TABS: 1.0000 | ORAL_TABLET | ORAL | 0 refills | Status: AC | PRN

## 2018-10-23 MED ORDER — ONDANSETRON HCL 4 MG/2ML IJ SOLN
4.0000 mg | Freq: Once | INTRAMUSCULAR | Status: AC
  Administered 2018-10-23: 4 mg via INTRAVENOUS
  Filled 2018-10-23: qty 2

## 2018-10-23 NOTE — ED Provider Notes (Signed)
TIME SEEN: 12:11 AM  CHIEF COMPLAINT: Right shoulder injury  HPI: Patient is a 23 year old right-hand-dominant male with no significant past medical history who states that he was playing around when he fell onto his right side injuring his right shoulder.  States that "I think I blacked out because of pain for a few seconds".  No head injury.  No neck or back pain.  Complaining of shoulder pain only.  No numbness in the right arm.  Given fentanyl with EMS prior to arrival.  No previous history of shoulder injury, dislocation.  N.p.o. since 5 PM.  NKDA.  ROS: See HPI Constitutional: no fever  Eyes: no drainage  ENT: no runny nose   Cardiovascular:  no chest pain  Resp: no SOB  GI: no vomiting GU: no dysuria Integumentary: no rash  Allergy: no hives  Musculoskeletal: no leg swelling  Neurological: no slurred speech ROS otherwise negative  PAST MEDICAL HISTORY/PAST SURGICAL HISTORY:  No past medical history on file.  MEDICATIONS:  Prior to Admission medications   Medication Sig Start Date End Date Taking? Authorizing Provider  naproxen (NAPROSYN) 375 MG tablet Take 1 tablet (375 mg total) by mouth 2 (two) times daily. 10/09/13   Malvin Johns, MD  naproxen (NAPROSYN) 500 MG tablet Take 1 tablet (500 mg total) by mouth 2 (two) times daily. 09/05/14   Fredia Sorrow, MD  ondansetron (ZOFRAN ODT) 8 MG disintegrating tablet Take 1 tablet (8 mg total) by mouth every 8 (eight) hours as needed for nausea or vomiting. 10/08/13   Ashley Murrain, NP  predniSONE (DELTASONE) 20 MG tablet Take 3 tablets (60 mg total) by mouth daily. 02/26/13   Leone Haven, MD  traMADol (ULTRAM) 50 MG tablet Take 1 tablet (50 mg total) by mouth every 6 (six) hours as needed. 09/05/14   Fredia Sorrow, MD    ALLERGIES:  No Known Allergies  SOCIAL HISTORY:  Social History   Tobacco Use  . Smoking status: Never Smoker  . Smokeless tobacco: Never Used  Substance Use Topics  . Alcohol use: Yes    FAMILY  HISTORY: No family history on file.  EXAM: BP 132/87   Pulse 76   Temp 97.8 F (36.6 C)   Resp 18   Ht 5\' 10"  (1.778 m)   Wt 88.5 kg   SpO2 97%   BMI 27.98 kg/m  CONSTITUTIONAL: Alert and oriented and responds appropriately to questions.  Patient appears very uncomfortable; GCS 15 HEAD: Normocephalic; atraumatic EYES: Conjunctivae clear, PERRL, EOMI ENT: normal nose; no rhinorrhea; moist mucous membranes; pharynx without lesions noted; no dental injury; no septal hematoma NECK: Supple, no meningismus, no LAD; no midline spinal tenderness, step-off or deformity; trachea midline CARD: RRR; S1 and S2 appreciated; no murmurs, no clicks, no rubs, no gallops RESP: Normal chest excursion without splinting or tachypnea; breath sounds clear and equal bilaterally; no wheezes, no rhonchi, no rales; no hypoxia or respiratory distress CHEST:  chest wall stable, no crepitus or ecchymosis or deformity, nontender to palpation; no flail chest ABD/GI: Normal bowel sounds; non-distended; soft, non-tender, no rebound, no guarding; no ecchymosis or other lesions noted PELVIS:  stable, nontender to palpation BACK:  The back appears normal and is non-tender to palpation, there is no CVA tenderness; no midline spinal tenderness, step-off or deformity EXT: Patient has loss of fullness of the right shoulder with significant pain in this area.  No tenderness over the distal right humerus, elbow, forearm, wrist or hand.  He has normal  range of motion in the hand.  2+ radial pulse.  Normal capillary refill in the right fingertips.  Sensation throughout the right arm is normal.  No joint effusion.  Compartments are soft.  No other major deformity noted of patient's extremities. SKIN: Normal color for age and race; warm NEURO: Moves all extremities equally PSYCH: The patient's mood and manner are appropriate. Grooming and personal hygiene are appropriate.  MEDICAL DECISION MAKING: Patient here with mechanical fall  onto her right shoulder.  Shoulder appears dislocated.  Will obtain x-rays.  Neurovascular intact distally.  Will give Dilaudid for pain control.  Will keep n.p.o. for possible sedation for reduction.  ED PROGRESS: Shoulder easily reduced using abduction, traction and external rotation.  Sedation achieved with 90 mg of IV propofol.  Neurovascularly intact distally after reduction.  X-rays confirm reduction and no fracture.  Patient in shoulder immobilizer.  Will be discharged with outpatient orthopedic follow-up and prescription of pain medication.  At this time, I do not feel there is any life-threatening condition present. I have reviewed and discussed all results (EKG, imaging, lab, urine as appropriate) and exam findings with patient/family. I have reviewed nursing notes and appropriate previous records.  I feel the patient is safe to be discharged home without further emergent workup and can continue workup as an outpatient as needed. Discussed usual and customary return precautions. Patient/family verbalize understanding and are comfortable with this plan.  Outpatient follow-up has been provided as needed. All questions have been answered.   Reduction of dislocation  Date/Time: 10/23/2018 1:32 AM Performed by: Dosia Yodice, Layla MawKristen N, DO Authorized by: Jodilyn Giese, Layla MawKristen N, DO  Consent: Written consent obtained. Risks and benefits: risks, benefits and alternatives were discussed Consent given by: patient Patient understanding: patient states understanding of the procedure being performed Patient consent: the patient's understanding of the procedure matches consent given Procedure consent: procedure consent matches procedure scheduled Relevant documents: relevant documents present and verified Test results: test results available and properly labeled Site marked: the operative site was marked Imaging studies: imaging studies available Required items: required blood products, implants, devices, and  special equipment available Patient identity confirmed: verbally with patient and arm band Time out: Immediately prior to procedure a "time out" was called to verify the correct patient, procedure, equipment, support staff and site/side marked as required. Local anesthesia used: no  Anesthesia: Local anesthesia used: no  Sedation: Patient sedated: yes Sedation type: moderate (conscious) sedation Sedatives: propofol Analgesia: hydromorphone Sedation start date/time: 10/23/2018 1:16 AM Sedation end date/time: 10/23/2018 1:46 AM Vitals: Vital signs were monitored during sedation.  Patient tolerance: patient tolerated the procedure well with no immediate complications  .Sedation  Date/Time: 10/23/2018 1:34 AM Performed by: Ia Leeb, Layla MawKristen N, DO Authorized by: Natisha Trzcinski, Layla MawKristen N, DO   Consent:    Consent obtained:  Written   Consent given by:  Patient   Risks discussed:  Allergic reaction, dysrhythmia, inadequate sedation, nausea, prolonged hypoxia resulting in organ damage, prolonged sedation necessitating reversal, respiratory compromise necessitating ventilatory assistance and intubation and vomiting   Alternatives discussed:  Analgesia without sedation, anxiolysis and regional anesthesia Universal protocol:    Procedure explained and questions answered to patient or proxy's satisfaction: yes     Relevant documents present and verified: yes     Test results available and properly labeled: yes     Imaging studies available: yes     Required blood products, implants, devices, and special equipment available: yes     Site/side marked: yes  Immediately prior to procedure a time out was called: yes     Patient identity confirmation method:  Verbally with patient and arm band Indications:    Procedure performed:  Dislocation reduction   Procedure necessitating sedation performed by:  Physician performing sedation Pre-sedation assessment:    Time since last food or drink:  8 hours   ASA  classification: class 1 - normal, healthy patient     Neck mobility: normal     Mouth opening:  3 or more finger widths   Thyromental distance:  4 finger widths   Mallampati score:  I - soft palate, uvula, fauces, pillars visible   Pre-sedation assessments completed and reviewed: airway patency, cardiovascular function, hydration status, mental status, nausea/vomiting, pain level, respiratory function and temperature     Pre-sedation assessment completed:  10/23/2018 1:00 AM Immediate pre-procedure details:    Reassessment: Patient reassessed immediately prior to procedure     Reviewed: vital signs, relevant labs/tests and NPO status     Verified: bag valve mask available, emergency equipment available, intubation equipment available, IV patency confirmed, oxygen available, reversal medications available and suction available   Procedure details (see MAR for exact dosages):    Preoxygenation:  Nasal cannula   Sedation:  Propofol   Analgesia:  Hydromorphone   Intra-procedure monitoring:  Blood pressure monitoring, cardiac monitor, continuous pulse oximetry, frequent LOC assessments, frequent vital sign checks and continuous capnometry   Intra-procedure events: none     Total Provider sedation time (minutes):  15 Post-procedure details:    Post-sedation assessment completed:  10/23/2018 1:47 AM   Attendance: Constant attendance by certified staff until patient recovered     Recovery: Patient returned to pre-procedure baseline     Post-sedation assessments completed and reviewed: airway patency, cardiovascular function, hydration status, mental status, nausea/vomiting, pain level, respiratory function and temperature     Patient is stable for discharge or admission: yes     Patient tolerance:  Tolerated well, no immediate complications  .proc   CRITICAL CARE Performed by: Rochele RaringKristen Cornel Werber   Total critical care time: 40 minutes  Critical care time was exclusive of separately billable  procedures and treating other patients.  Critical care was necessary to treat or prevent imminent or life-threatening deterioration.  Critical care was time spent personally by me on the following activities: development of treatment plan with patient and/or surrogate as well as nursing, discussions with consultants, evaluation of patient's response to treatment, examination of patient, obtaining history from patient or surrogate, ordering and performing treatments and interventions, ordering and review of laboratory studies, ordering and review of radiographic studies, pulse oximetry and re-evaluation of patient's condition.       Harve Spradley, Layla MawKristen N, DO 10/23/18 [phone]

## 2019-07-19 ENCOUNTER — Ambulatory Visit: Payer: 59 | Attending: Internal Medicine

## 2019-07-21 ENCOUNTER — Ambulatory Visit: Payer: 59

## 2019-12-17 IMAGING — DX PORTABLE RIGHT SHOULDER
3 series · 3 of 3 positions shown · non-contrast
Comparison: Pre reduction radiographs earlier this day

CLINICAL DATA: Postreduction shoulder dislocation

EXAM:
PORTABLE RIGHT SHOULDER

[shoulder axial]
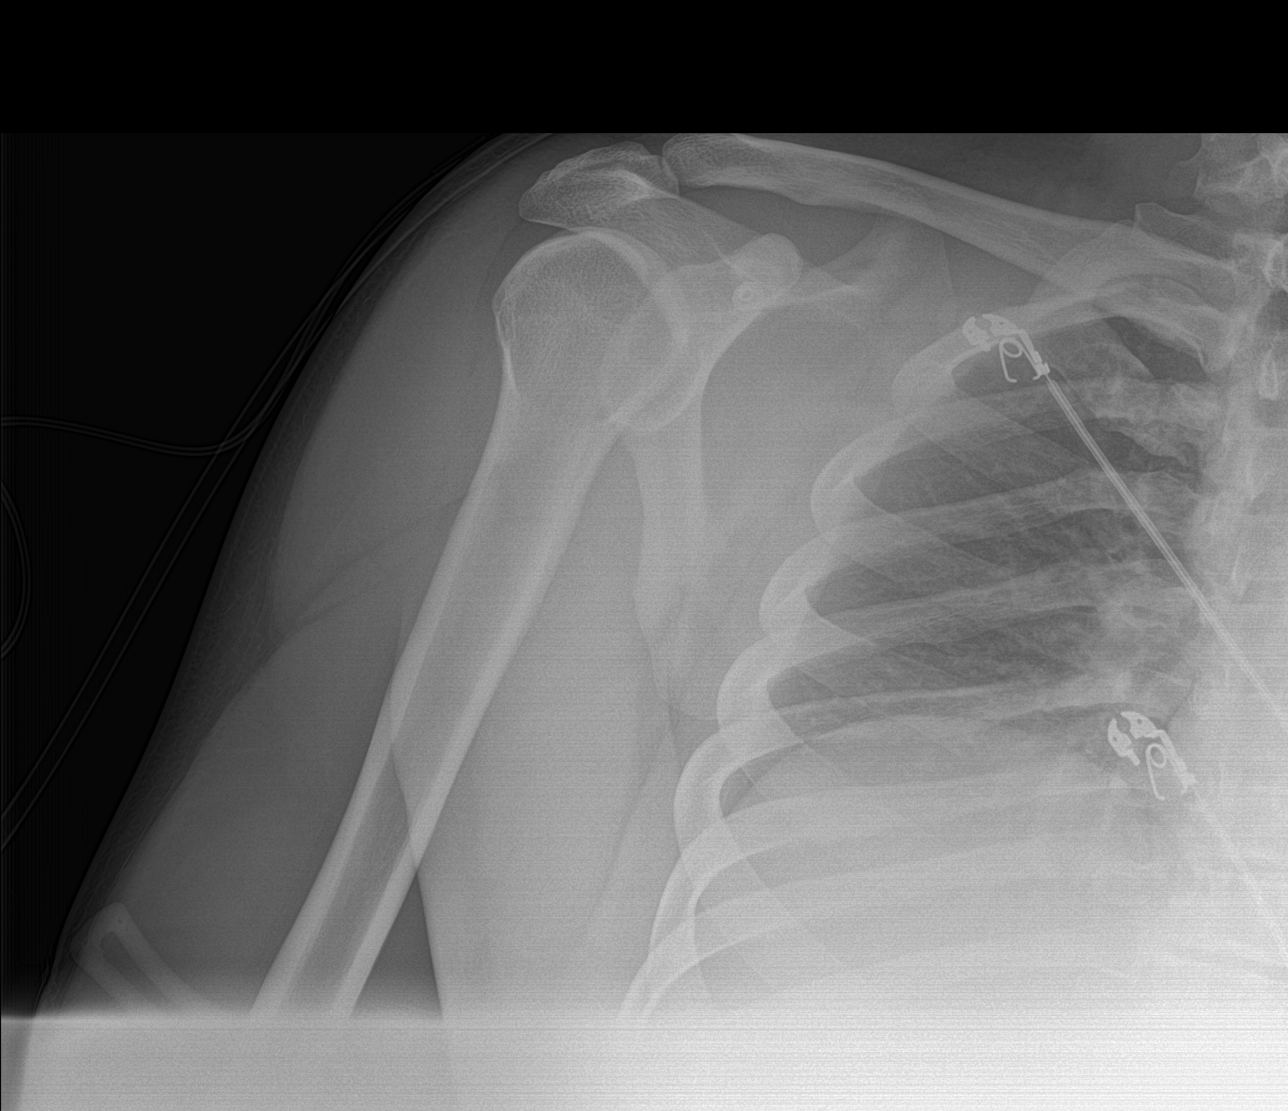

[shoulder ap]
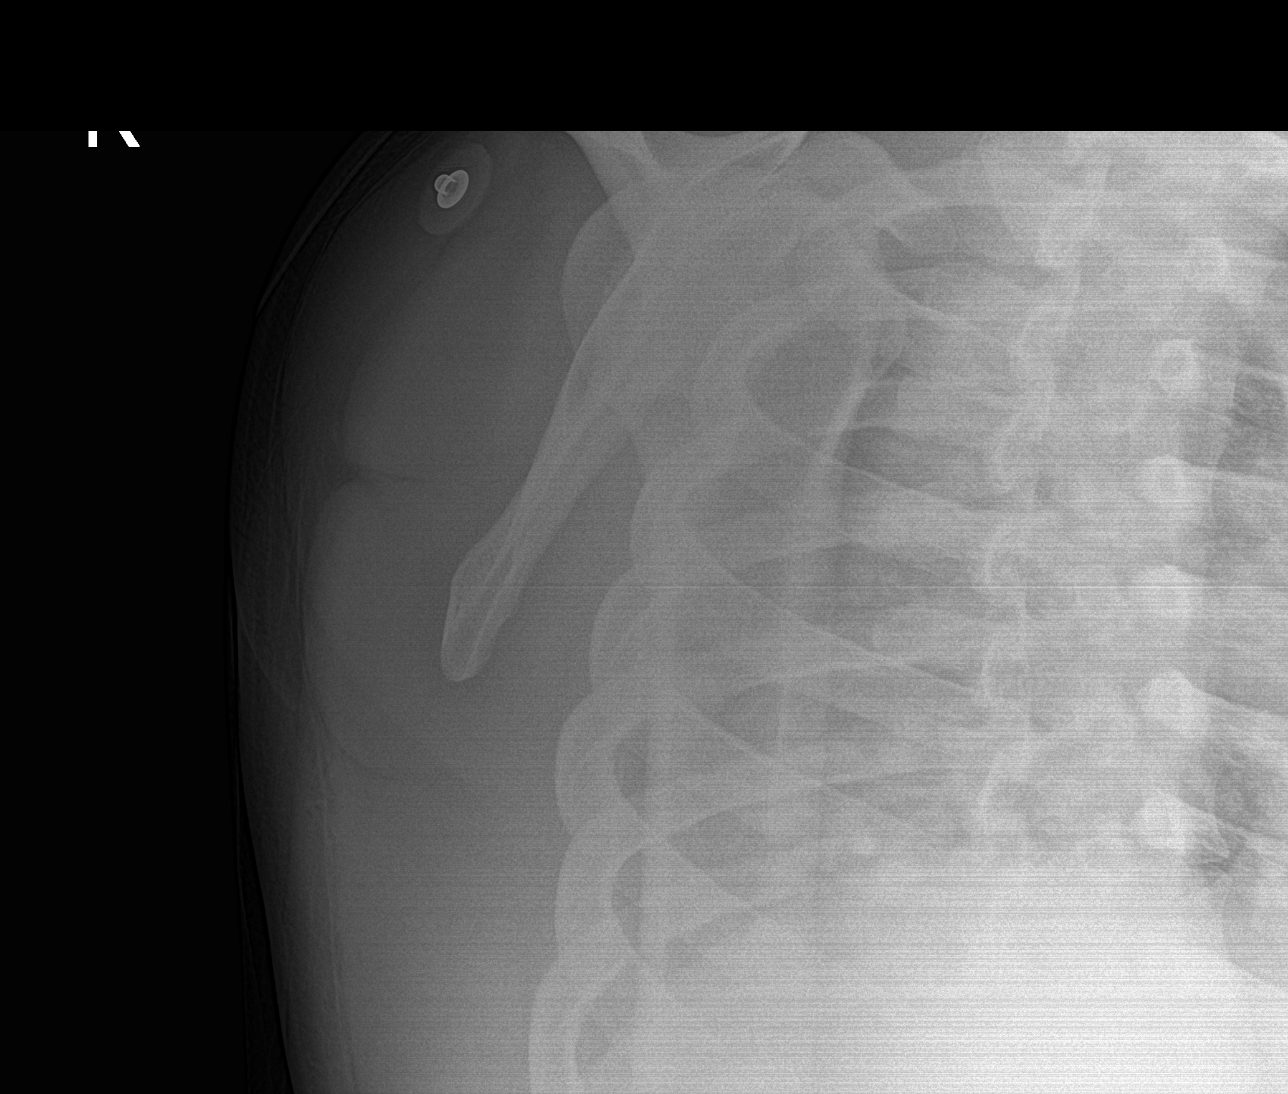

[shoulder obl]
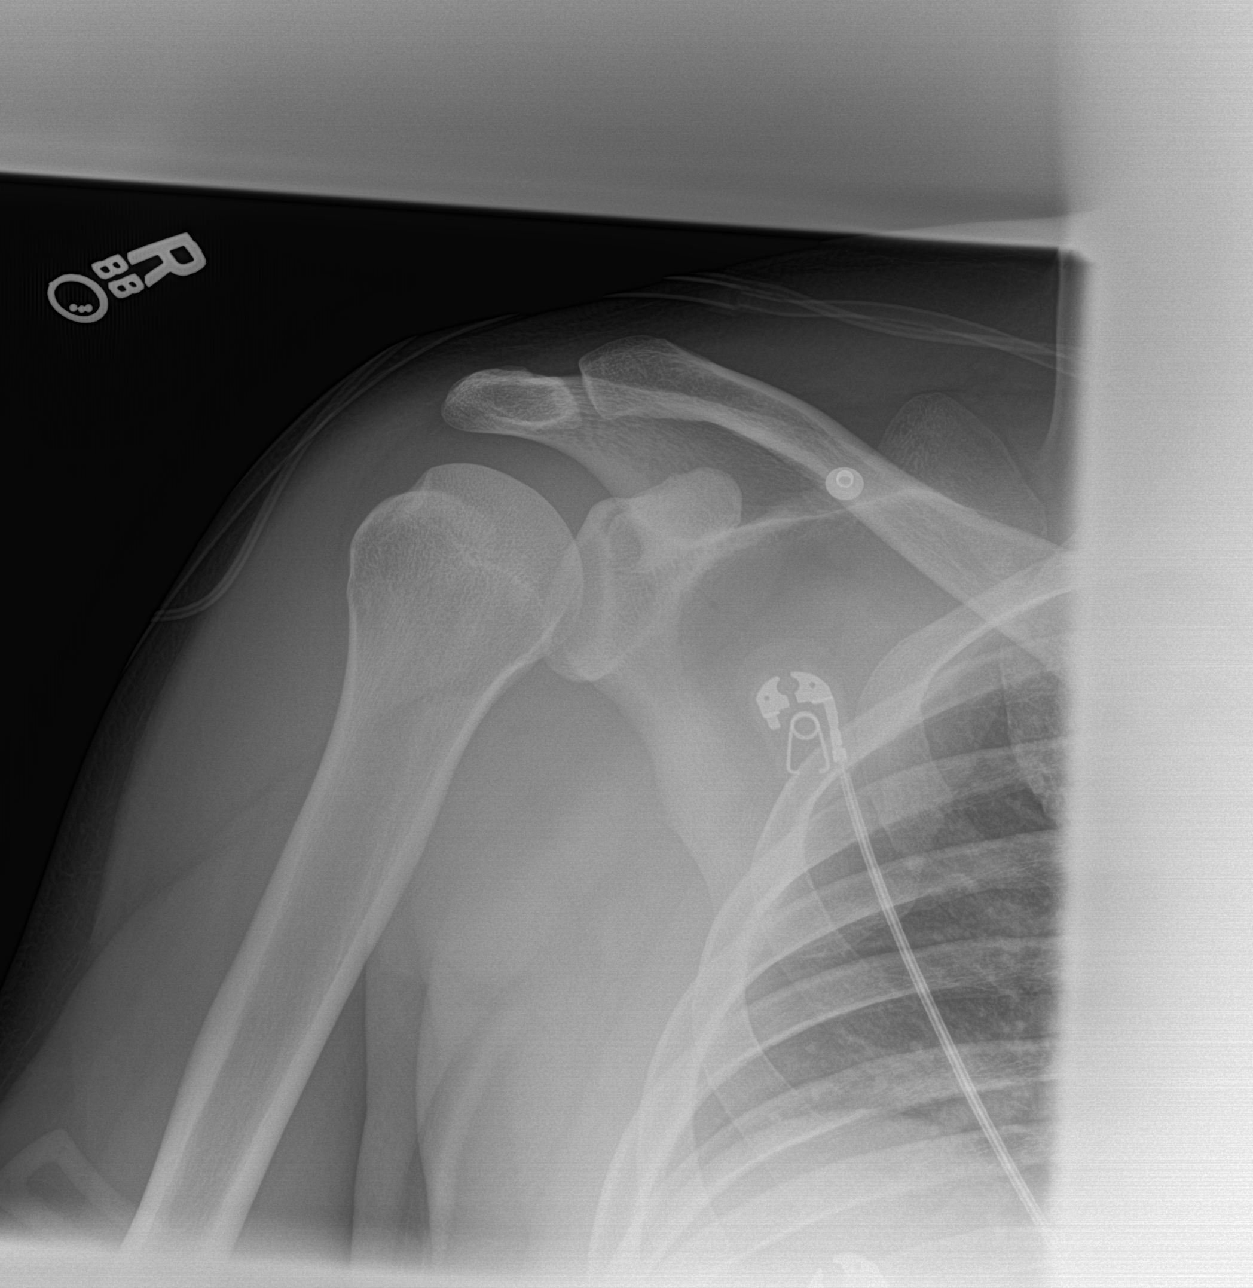

[3 of 3 positions shown; findings below may reference images not displayed]

FINDINGS: Prior anterior shoulder dislocation has been reduced, glenohumeral
alignment is normal. Small Hill-Sachs impaction injury to the
lateral humeral head. No visualized bony Bankart. Acromioclavicular
joint is congruent.
IMPRESSION: Normal glenohumeral alignment postreduction. Small Hill-Sachs
impaction injury.
